# Patient Record
Sex: Male | Born: 2005
Health system: Southern US, Community
[De-identification: ages and names within clinical notes are randomized; demographics above are authoritative.]

## PROBLEM LIST (undated history)

## (undated) DIAGNOSIS — E669 Obesity, unspecified: Secondary | ICD-10-CM

## (undated) HISTORY — DX: Obesity, unspecified: E66.9

---
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
## 2023-01-13 ENCOUNTER — Ambulatory Visit: Payer: Commercial Managed Care - PPO | Admitting: Family Medicine

## 2023-01-13 ENCOUNTER — Encounter: Payer: Self-pay | Admitting: Family Medicine

## 2023-01-13 VITALS — BP 139/79 | HR 73 | Temp 96.8°F | Ht 73.0 in | Wt 244.4 lb

## 2023-01-13 DIAGNOSIS — H6123 Impacted cerumen, bilateral: Secondary | ICD-10-CM

## 2023-01-13 NOTE — Progress Notes (Signed)
   Jemal G Affinito is a 18 y.o. male who presents today for an office visit.  Assessment/Plan:  Cerumen Impaction  Successfully irrigated by RMA today.  He can use over-the-counter Debrox or hydrogen peroxide as needed prevent future buildup.  He will follow-up as needed.     Subjective:  HPI:  Patient here with concern for cerumen impaction. He has noticed decreased hearing hte last several days.  Consistent with previous cerumen impactions.  He is not using anything at home to prevent this.       Objective:  Physical Exam: BP 139/79   Pulse 73   Temp (!) 96.8 F (36 C) (Temporal)   Ht 6' 1 (1.854 m)   Wt 244 lb 6.4 oz (110.9 kg)   SpO2 100%   BMI 32.24 kg/m   Gen: No acute distress, resting comfortably HEENT: Bilateral EAC with cerumen impaction.  TM is visualized after successful irrigation with no abnormalities. Neuro: Grossly normal, moves all extremities Psych: Normal affect and thought content      Zehava Turski M. Kennyth, MD 01/13/2023 11:30 AM

## 2023-09-04 ENCOUNTER — Encounter (HOSPITAL_BASED_OUTPATIENT_CLINIC_OR_DEPARTMENT_OTHER): Payer: Self-pay

## 2023-09-04 ENCOUNTER — Ambulatory Visit (HOSPITAL_BASED_OUTPATIENT_CLINIC_OR_DEPARTMENT_OTHER)
Admission: RE | Admit: 2023-09-04 | Discharge: 2023-09-04 | Disposition: A | Discharge status: Home / Self Care | Source: Ambulatory Visit | Attending: Family Medicine | Admitting: Family Medicine

## 2023-09-04 VITALS — BP 138/84 | HR 84 | Temp 98.4°F | Resp 20

## 2023-09-04 DIAGNOSIS — Z025 Encounter for examination for participation in sport: Secondary | ICD-10-CM

## 2023-09-04 NOTE — Discharge Instructions (Signed)
 You are cleared to play.  See attached paperwork.

## 2023-09-04 NOTE — ED Provider Notes (Signed)
 [facility]    CSN: [id number] Arrival date & time: 09/04/23  0949      History   Chief Complaint Chief Complaint  Patient presents with   SPORTS EXAM    HPI Jerry Kennedy is a 18 y.o. male.   Patient presents today for routine sports physical.  This is his second year playing lacrosse and he requires the form to be completed.  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  He does have a history of cardiac murmur noted on his chart but reports that this was auscultated 1 time when he was seen by cardiologist and this was normal and he did not require any follow-up.  He denies any symptoms including palpitations, chest pain, leg swelling, shortness of breath.  He denies any additional past medical history including asthma.  He does not take any medication on a regular basis.  He did have a concussion several years ago but has not had any additional head traumas and does not have ongoing symptoms related to this.  He also reports significantly injury to his right ankle but this did not require surgery and has completely healed with no ongoing sequela.  He denies any family history of sudden cardiac death.  He denies any symptoms including syncope, presyncope, chest pain, shortness of breath, palpitations, leg swelling, headache, dizziness, lightheadedness, cough, congestion.    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 No pertinent surgical history.     Home Medications    Prior to Admission medications   Not on File    Family History Family History  Problem Relation Age of Onset   Cancer Maternal Grandmother    Heart attack Maternal Grandfather    Diabetes Paternal Grandmother    Hearing loss Paternal Grandmother     Social History Social History   Tobacco Use   Smoking status: Never   Smokeless  tobacco: Never  Substance Use Topics   Alcohol use: No   Drug use: No     Allergies   Patient has no known allergies.   Review of Systems Review of Systems  Constitutional:  Negative for activity change, appetite change, fatigue and fever.  Eyes:  Negative for visual disturbance.  Respiratory:  Negative for cough, chest tightness, shortness of breath and wheezing.   Cardiovascular:  Negative for chest pain, palpitations and leg swelling.  Gastrointestinal:  Negative for abdominal pain, diarrhea, nausea and vomiting.  Musculoskeletal:  Negative for arthralgias, gait problem, joint swelling and myalgias.  Skin:  Negative for rash.  Neurological:  Negative for dizziness, seizures, syncope, speech difficulty, weakness, light-headedness and headaches.     Physical Exam Triage Vital Signs ED Triage Vitals  Encounter Vitals Group     BP 09/04/23 1008 138/84     Girls Systolic BP Percentile --      Girls Diastolic BP Percentile --      Boys Systolic BP Percentile --      Boys Diastolic BP Percentile --      Pulse Rate 09/04/23 1008 84     Resp 09/04/23 1008 20     Temp 09/04/23 1008 98.4 F (36.9 C)     Temp Source 09/04/23 1008 Oral     SpO2 09/04/23 1008 98 %     Weight --  Height --      Head Circumference --      Peak Flow --      Pain Score 09/04/23 1009 0     Pain Loc --      Pain Education --      Exclude from Growth Chart --    No data found.  Updated Vital Signs BP 138/84 (BP Location: Left Arm)   Pulse 84   Temp 98.4 F (36.9 C) (Oral)   Resp 20   SpO2 98%   Visual Acuity Right Eye Distance:   Left Eye Distance:   Bilateral Distance:    Right Eye Near:   Left Eye Near:    Bilateral Near:     Physical Exam Vitals reviewed.  Constitutional:      General: He is awake.     Appearance: Normal appearance. He is well-developed. He is not ill-appearing.     Comments: Very pleasant male stated age in no acute distress sitting comfortably in exam  room  HENT:     Head: Normocephalic and atraumatic.     Right Ear: External ear normal. There is impacted cerumen.     Left Ear: External ear normal. There is impacted cerumen.     Nose: Nose normal.     Mouth/Throat:     Pharynx: Uvula midline. No oropharyngeal exudate or posterior oropharyngeal erythema.  Eyes:     Extraocular Movements: Extraocular movements intact.     Conjunctiva/sclera: Conjunctivae normal.     Pupils: Pupils are equal, round, and reactive to light.  Cardiovascular:     Rate and Rhythm: Normal rate and regular rhythm.     Heart sounds: Normal heart sounds, S1 normal and S2 normal. No murmur heard.    Comments: No murmur auscultated with sitting, standing, squatting, Valsalva Pulmonary:     Effort: Pulmonary effort is normal. No accessory muscle usage or respiratory distress.     Breath sounds: Normal breath sounds. No stridor. No wheezing, rhonchi or rales.  Abdominal:     General: Bowel sounds are normal.     Palpations: Abdomen is soft.     Tenderness: There is no abdominal tenderness.  Musculoskeletal:     Cervical back: Normal range of motion and neck supple. No spinous process tenderness or muscular tenderness.     Comments: Strength 5/5 bilateral upper and lower extremities.  Normal active range of motion in all major joints.  Lymphadenopathy:     Cervical: No cervical adenopathy.  Neurological:     General: No focal deficit present.     Mental Status: He is alert and oriented to person, place, and time.     Cranial Nerves: Cranial nerves 2-12 are intact.     Motor: Motor function is intact.     Coordination: Coordination is intact.     Gait: Gait is intact.     Comments: Cranial nerves II through XII grossly intact.  No focal neurological defect on exam.  Psychiatric:        Behavior: Behavior is cooperative.      UC Treatments / Results  Labs (all labs ordered are listed, but only abnormal results are displayed) Labs Reviewed - No data to  display  EKG   Radiology No results found.  Procedures Procedures (including critical care time)  Medications Ordered in UC Medications - No data to display  Initial Impression / Assessment and Plan / UC Course  I have reviewed the triage vital signs and the nursing notes.  Pertinent labs & imaging results that were available during my care of the patient were reviewed by me and considered in my medical decision making (see chart for details).     Patient's form requested sickle cell testing only for freshman and transferring students and reports that he had this done last year and it was normal.  This was not completed today.  We also discussed that we are often concerning patients have a history of a murmur but he reports being evaluated by cardiology and cleared and that this was only auscultated 1 time when he was sick.  I was unable to auscultate a murmur and he denies any concerning symptoms.  He was cleared to play without restriction.  Form was completed and originals provided to patient.  Copy was scanned to chart.  Final Clinical Impressions(s) / UC Diagnoses   Final diagnoses:  Routine sports physical exam     Discharge Instructions      You are cleared to play.  See attached paperwork.     ED Prescriptions   None    PDMP not reviewed this encounter.   Sherrell Rocky POUR, PA-C 09/04/23 1047

## 2023-09-04 NOTE — ED Triage Notes (Signed)
 Here for Sports Physical Will be playing [employer]

## 2023-09-25 ENCOUNTER — Other Ambulatory Visit (HOSPITAL_COMMUNITY): Payer: Self-pay

## 2023-09-25 ENCOUNTER — Other Ambulatory Visit (HOSPITAL_BASED_OUTPATIENT_CLINIC_OR_DEPARTMENT_OTHER): Payer: Self-pay

## 2023-09-25 ENCOUNTER — Ambulatory Visit

## 2023-09-25 ENCOUNTER — Ambulatory Visit (INDEPENDENT_AMBULATORY_CARE_PROVIDER_SITE_OTHER): Admitting: Podiatry

## 2023-09-25 DIAGNOSIS — L03031 Cellulitis of right toe: Secondary | ICD-10-CM

## 2023-09-25 DIAGNOSIS — L600 Ingrowing nail: Secondary | ICD-10-CM

## 2023-09-25 MED ORDER — CEPHALEXIN 500 MG PO CAPS
500.0000 mg | ORAL_CAPSULE | Freq: Three times a day (TID) | ORAL | 0 refills | Status: AC
  Filled 2023-09-25 (×2): qty 21, 7d supply, fill #0

## 2023-09-25 NOTE — Patient Instructions (Addendum)

## 2023-09-25 NOTE — Progress Notes (Signed)
 Subjective:  Patient ID: Jerry Kennedy, male    DOB: Nov 02, 2005,  MRN: [id number]  NAHZIR POHLE presents to clinic today for:  Chief Complaint  Patient presents with   Ingrown Toenail    Right hallux, medial border, infected, draining, and some skin is over the tip of the nail.  Not diabetic and no anti coag.    Patient with concern of right great toe, medial border, being ingrown.  He did have a temporary procedure performed to it about a year ago.  It did grow back and get ingrown again.  He has not been doing much care to it.  There has been some drainage to the area.  He is not currently on any antibiotics.  No Known Allergies  Objective:  Coltrane G Langenderfer is a pleasant 18 y.o. male in NAD. AAO x 3.  Vascular Examination: Capillary refill time is 3-5 seconds to toes bilateral. Palpable pedal pulses b/l LE. Digital hair present b/l. No pedal edema b/l. Skin temperature gradient WNL b/l. No varicosities b/l. No cyanosis or clubbing noted b/l.   Dermatological Examination: There is incurvation of the right hallux medial nail border.  There is pain on palpation of the affected nail border.  There is localized erythema and edema.  No active drainage is noted  Neurological Examination: Epicritic sensation is intact to the toes.  Assessment/Plan: 1. Ingrown toenail   2. Paronychia of toe of right foot     Meds ordered this encounter  Medications   cephALEXin  (KEFLEX ) 500 MG capsule    Sig: Take 1 capsule (500 mg total) by mouth 3 (three) times daily for 7 days.    Dispense:  21 capsule    Refill:  0   Discussed patient's condition today.  After obtaining patient consent, the right hallux was anesthetized with a 50:50 mixture of 1% lidocaine plain and 0.5% bupivacaine plain for a total of 3cc's administered.  Upon confirmation of anesthesia, a freer elevator was utilized to free the right hallux medial nail border from the nail bed.  The nail border was then avulsed proximal  to the eponychium and removed in toto.  The area was inspected for any remaining spicules.  A chemical matrixectomy was performed with NaOH and neutralized with acetic acid solution.  Antibiotic ointment and a DSD were applied, followed by a Coban dressing.  Patient tolerated the anesthetic and procedure well and will f/u in 2-3 weeks for recheck.  Patient given post-procedure instructions for daily 15-minute Epsom salt soaks, antibiotic ointment and daily use of Bandaids until toe starts to dry / form eschar.   Prescription for cephalexin  500 mg 1 tablet p.o. 3 times daily sent to his pharmacy.  This was originally sent to [facility] health in [place], but after speaking with the patient and learning that his parents are out of town, this was sent to the [facility] health pharmacy on [address] so that it is much closer for him to pick this up.  Return in about 2 weeks (around 10/09/2023) for PNA recheck.   Awanda CHARM Imperial, DPM, FACFAS [facility]     [address], [place] [address]  Office [phone]  Fax [phone]

## 2023-10-02 ENCOUNTER — Ambulatory Visit: Admitting: Podiatry
